# Patient Record
Sex: Female | Born: 1975 | Race: Black or African American | Hispanic: No | Marital: Single | Smoking: Never smoker
Health system: Southern US, Community
[De-identification: ages and names within clinical notes are randomized; demographics above are authoritative.]

## PROBLEM LIST (undated history)

## (undated) DIAGNOSIS — A630 Anogenital (venereal) warts: Secondary | ICD-10-CM

## (undated) HISTORY — DX: Anogenital (venereal) warts: A63.0

## (undated) HISTORY — PX: HAMMER TOE SURGERY: SHX385

---

## 2018-01-28 ENCOUNTER — Other Ambulatory Visit: Payer: Self-pay | Admitting: Family Medicine

## 2018-01-28 DIAGNOSIS — Z1231 Encounter for screening mammogram for malignant neoplasm of breast: Secondary | ICD-10-CM

## 2018-03-05 ENCOUNTER — Ambulatory Visit: Payer: Self-pay

## 2018-04-11 ENCOUNTER — Ambulatory Visit: Payer: Self-pay

## 2018-04-15 ENCOUNTER — Ambulatory Visit: Payer: Self-pay

## 2018-05-16 ENCOUNTER — Ambulatory Visit: Payer: Self-pay

## 2018-05-30 ENCOUNTER — Encounter: Payer: Self-pay | Admitting: Obstetrics and Gynecology

## 2018-06-06 ENCOUNTER — Ambulatory Visit: Payer: Self-pay

## 2018-06-13 ENCOUNTER — Other Ambulatory Visit (HOSPITAL_COMMUNITY)
Admission: RE | Admit: 2018-06-13 | Discharge: 2018-06-13 | Disposition: A | Discharge status: Home / Self Care | Payer: BLUE CROSS/BLUE SHIELD | Source: Ambulatory Visit | Attending: Obstetrics and Gynecology | Admitting: Obstetrics and Gynecology

## 2018-06-13 ENCOUNTER — Ambulatory Visit (INDEPENDENT_AMBULATORY_CARE_PROVIDER_SITE_OTHER): Payer: BLUE CROSS/BLUE SHIELD | Admitting: Obstetrics and Gynecology

## 2018-06-13 ENCOUNTER — Encounter: Payer: Self-pay | Admitting: Obstetrics and Gynecology

## 2018-06-13 VITALS — BP 118/56 | HR 66 | Resp 12 | Ht 62.5 in | Wt 126.1 lb

## 2018-06-13 DIAGNOSIS — N9089 Other specified noninflammatory disorders of vulva and perineum: Secondary | ICD-10-CM

## 2018-06-13 DIAGNOSIS — Z0000 Encounter for general adult medical examination without abnormal findings: Secondary | ICD-10-CM

## 2018-06-13 DIAGNOSIS — Z124 Encounter for screening for malignant neoplasm of cervix: Secondary | ICD-10-CM | POA: Diagnosis not present

## 2018-06-13 DIAGNOSIS — Z113 Encounter for screening for infections with a predominantly sexual mode of transmission: Secondary | ICD-10-CM | POA: Diagnosis present

## 2018-06-13 DIAGNOSIS — Z01419 Encounter for gynecological examination (general) (routine) without abnormal findings: Secondary | ICD-10-CM | POA: Diagnosis not present

## 2018-06-13 NOTE — Patient Instructions (Signed)
EXERCISE AND DIET:  We recommended that you start or continue a regular exercise program for good health. Regular exercise means any activity that makes your heart beat faster and makes you sweat.  We recommend exercising at least 30 minutes per day at least 3 days a week, preferably 4 or 5.  We also recommend a diet low in fat and sugar.  Inactivity, poor dietary choices and obesity can cause diabetes, heart attack, stroke, and kidney damage, among others.    ALCOHOL AND SMOKING:  Women should limit their alcohol intake to no more than 7 drinks/beers/glasses of wine (combined, not each!) per week. Moderation of alcohol intake to this level decreases your risk of breast cancer and liver damage. And of course, no recreational drugs are part of a healthy lifestyle.  And absolutely no smoking or even second hand smoke. Most people know smoking can cause heart and lung diseases, but did you know it also contributes to weakening of your bones? Aging of your skin?  Yellowing of your teeth and nails?  CALCIUM AND VITAMIN D:  Adequate intake of calcium and Vitamin D are recommended.  The recommendations for exact amounts of these supplements seem to change often, but generally speaking 1,000 mg of calcium (between diet and supplement) and 800 units of Vitamin D per day seems prudent. Certain women may benefit from higher intake of Vitamin D.  If you are among these women, your doctor will have told you during your visit.    PAP SMEARS:  Pap smears, to check for cervical cancer or precancers,  have traditionally been done yearly, although recent scientific advances have shown that most women can have pap smears less often.  However, every woman still should have a physical exam from her gynecologist every year. It will include a breast check, inspection of the vulva and vagina to check for abnormal growths or skin changes, a visual exam of the cervix, and then an exam to evaluate the size and shape of the uterus and  ovaries.  And after 45 years of age, a rectal exam is indicated to check for rectal cancers. We will also provide age appropriate advice regarding health maintenance, like when you should have certain vaccines, screening for sexually transmitted diseases, bone density testing, colonoscopy, mammograms, etc.   MAMMOGRAMS:  All women over 40 years old should have a yearly mammogram. Many facilities now offer a "3D" mammogram, which may cost around $50 extra out of pocket. If possible,  we recommend you accept the option to have the 3D mammogram performed.  It both reduces the number of women who will be called back for extra views which then turn out to be normal, and it is better than the routine mammogram at detecting truly abnormal areas.    COLON CANCER SCREENING: Now recommend starting at age 45. At this time colonoscopy is not covered for routine screening until 50. There are take home tests that can be done between 45-49.   COLONOSCOPY:  Colonoscopy to screen for colon cancer is recommended for all women at age 50.  We know, you hate the idea of the prep.  We agree, BUT, having colon cancer and not knowing it is worse!!  Colon cancer so often starts as a polyp that can be seen and removed at colonscopy, which can quite literally save your life!  And if your first colonoscopy is normal and you have no family history of colon cancer, most women don't have to have it again for   10 years.  Once every ten years, you can do something that may end up saving your life, right?  We will be happy to help you get it scheduled when you are ready.  Be sure to check your insurance coverage so you understand how much it will cost.  It may be covered as a preventative service at no cost, but you should check your particular policy.      Breast Self-Awareness Breast self-awareness means being familiar with how your breasts look and feel. It involves checking your breasts regularly and reporting any changes to your  health care provider. Practicing breast self-awareness is important. A change in your breasts can be a sign of a serious medical problem. Being familiar with how your breasts look and feel allows you to find any problems early, when treatment is more likely to be successful. All women should practice breast self-awareness, including women who have had breast implants. How to do a breast self-exam One way to learn what is normal for your breasts and whether your breasts are changing is to do a breast self-exam. To do a breast self-exam: Look for Changes  1. Remove all the clothing above your waist. 2. Stand in front of a mirror in a room with good lighting. 3. Put your hands on your hips. 4. Push your hands firmly downward. 5. Compare your breasts in the mirror. Look for differences between them (asymmetry), such as: ? Differences in shape. ? Differences in size. ? Puckers, dips, and bumps in one breast and not the other. 6. Look at each breast for changes in your skin, such as: ? Redness. ? Scaly areas. 7. Look for changes in your nipples, such as: ? Discharge. ? Bleeding. ? Dimpling. ? Redness. ? A change in position. Feel for Changes Carefully feel your breasts for lumps and changes. It is best to do this while lying on your back on the floor and again while sitting or standing in the shower or tub with soapy water on your skin. Feel each breast in the following way:  Place the arm on the side of the breast you are examining above your head.  Feel your breast with the other hand.  Start in the nipple area and make  inch (2 cm) overlapping circles to feel your breast. Use the pads of your three middle fingers to do this. Apply light pressure, then medium pressure, then firm pressure. The light pressure will allow you to feel the tissue closest to the skin. The medium pressure will allow you to feel the tissue that is a little deeper. The firm pressure will allow you to feel the tissue  close to the ribs.  Continue the overlapping circles, moving downward over the breast until you feel your ribs below your breast.  Move one finger-width toward the center of the body. Continue to use the  inch (2 cm) overlapping circles to feel your breast as you move slowly up toward your collarbone.  Continue the up and down exam using all three pressures until you reach your armpit.  Write Down What You Find  Write down what is normal for each breast and any changes that you find. Keep a written record with breast changes or normal findings for each breast. By writing this information down, you do not need to depend only on memory for size, tenderness, or location. Write down where you are in your menstrual cycle, if you are still menstruating. If you are having trouble noticing differences   in your breasts, do not get discouraged. With time you will become more familiar with the variations in your breasts and more comfortable with the exam. How often should I examine my breasts? Examine your breasts every month. If you are breastfeeding, the best time to examine your breasts is after a feeding or after using a breast pump. If you menstruate, the best time to examine your breasts is 5-7 days after your period is over. During your period, your breasts are lumpier, and it may be more difficult to notice changes. When should I see my health care provider? See your health care provider if you notice:  A change in shape or size of your breasts or nipples.  A change in the skin of your breast or nipples, such as a reddened or scaly area.  Unusual discharge from your nipples.  A lump or thick area that was not there before.  Pain in your breasts.  Anything that concerns you.  

## 2018-06-13 NOTE — Progress Notes (Signed)
43 y.o. G0P0000 Single Black or African American Not Hispanic or Latino female here for annual exam.   She [facility]. The condyloma went down, still there.  Period Cycle (Days): 28 Period Duration (Days): 4-5 Period Pattern: Regular Menstrual Flow: Moderate Menstrual Control: Maxi pad, Tampon Menstrual Control Change Freq (Hours): changes pad/tampon every 3-4 hours Dysmenorrhea: None  Sexually active, same partner x 2 years, not living together. Using condoms. No dyspareunia.   Patient's last menstrual period was 05/30/2018.          Sexually active: Yes.    The current method of family planning is condoms every time.    Exercising: Yes.    running, walking, strength training Smoker:  no  Health Maintenance: Pap:  08-09-15 negative History of abnormal Pap:  no MMG: 08-26-14 BIRADS 1 negative, schedule 07-04-2018 at [facility] BMD:   n/a Colonoscopy: n/a [facility]:  [facility]: [facility]   reports that she has never smoked. She has never used smokeless tobacco. She reports current alcohol use of about 1.0 - 2.0 standard drinks of alcohol per week. She reports that she does not use drugs. 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 Family in [place] (2 brothers and a sister).   Past Medical History:  Diagnosis Date  . Genital warts     Past Surgical History:  Procedure Laterality Date  . HAMMER TOE SURGERY Bilateral    2000- left, 2003-right    Current Outpatient Medications  Medication Sig Dispense Refill  . cetirizine-pseudoephedrine (ZYRTEC-D) 5-120 MG tablet Take by mouth.    . fluticasone (FLONASE) 50 MCG/ACT nasal spray Place into the nose.    . Multiple Vitamin (MULTIVITAMIN) capsule Take by mouth.     No current facility-administered medications for this visit.     Family History  Problem Relation Age of Onset  . Hyperlipidemia Mother   . Hypertension Mother   . Stroke  Mother   Mom is doing okay, mild stroke  Review of Systems  Constitutional: Negative.   HENT: Negative.   Eyes: Negative.   Respiratory: Negative.   Cardiovascular: Negative.   Gastrointestinal: Negative.   Endocrine: Negative.   Genitourinary:       Genital wart  Musculoskeletal: Negative.   Skin: Negative.   Allergic/Immunologic: Negative.   Neurological: Negative.   Hematological: Negative.   Psychiatric/Behavioral: Negative.     Exam:   BP (!) 118/56 (BP Location: Right Arm, Patient Position: Sitting, Cuff Size: Normal)   Pulse 66   Resp 12   Ht 5' 2.5" (1.588 m)   Wt 126 lb 1.6 oz (57.2 kg)   LMP 05/30/2018   BMI 22.70 kg/m   Weight change: @WEIGHTCHANGE @ Height:   Height: 5' 2.5" (158.8 cm)  Ht Readings from Last 3 Encounters:  06/13/18 5' 2.5" (1.588 m)    General appearance: alert, cooperative and appears stated age Head: Normocephalic, without obvious abnormality, atraumatic Neck: no adenopathy, supple, symmetrical, trachea midline and thyroid normal to inspection and palpation Lungs: clear to auscultation bilaterally Cardiovascular: regular rate and rhythm Breasts: normal appearance, no masses or tenderness Abdomen: soft, non-tender; non distended,  no masses,  no organomegaly Extremities: extremities normal, atraumatic, no cyanosis or edema Skin: Skin color, texture, turgor normal. No rashes or lesions Lymph nodes: Cervical, supraclavicular, and axillary nodes normal. No abnormal inguinal nodes palpated Neurologic: Grossly normal   Pelvic: External genitalia:  2 small papillary, skin colored lesions on her upper clitoris               Urethra:  normal appearing urethra with no masses, tenderness or lesions              Bartholins and Skenes: normal                 Vagina: normal appearing vagina with normal color and discharge, no lesions              Cervix: no lesions               Bimanual Exam:  Uterus:  normal size, contour, position, consistency,  mobility, non-tender              Adnexa: no mass, fullness, tenderness               Rectovaginal: Confirms               Anus:  normal sphincter tone, no lesions  Chaperone was present for exam.  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 She desires removal.  She will finish her gardasil series at school

## 2018-06-16 LAB — CYTOLOGY - PAP
CHLAMYDIA, DNA PROBE: NEGATIVE
Diagnosis: NEGATIVE
HPV: NOT DETECTED
NEISSERIA GONORRHEA: NEGATIVE
Trichomonas: NEGATIVE

## 2018-06-25 NOTE — Progress Notes (Signed)
GYNECOLOGY  VISIT   HPI: 43 y.o.   Single Black or African American Not Hispanic or Latino  female   G0P0000 with Patient's last menstrual period was 06/24/2018 (exact date).   here for vulvar biopsy. She has 2 small vulvar lesions that got smaller with TCA, but didn't resolve. She was told they were condyloma.   GYNECOLOGIC HISTORY: Patient's last menstrual period was 06/24/2018 (exact date). Contraception: Condoms Menopausal hormone therapy: None       OB History    Gravida  0   Para  0   Term  0   Preterm  0   AB  0   Living  0     SAB  0   TAB  0   Ectopic  0   Multiple  0   Live Births  0              There are no active problems to display for this patient.   Past Medical History:  Diagnosis Date  . Genital warts     Past Surgical History:  Procedure Laterality Date  . HAMMER TOE SURGERY Bilateral    2000- left, 2003-right    Current Outpatient Medications  Medication Sig Dispense Refill  . cetirizine-pseudoephedrine (ZYRTEC-D) 5-120 MG tablet Take by mouth.    . fluticasone (FLONASE) 50 MCG/ACT nasal spray Place into the nose.    . Multiple Vitamin (MULTIVITAMIN) capsule Take by mouth.     No current facility-administered medications for this visit.      ALLERGIES: Patient has no known allergies.  Family History  Problem Relation Age of Onset  . Hyperlipidemia Mother   . Hypertension Mother   . Stroke Mother     Social History   Socioeconomic History  . Marital status: Single    Spouse name: Not on file  . Number of children: Not on file  . Years of education: Not on file  . Highest education level: Not on file  Occupational History  . Not on file  Social Needs  . Financial resource strain: Not on file  . Food insecurity:    Worry: Not on file    Inability: Not on file  . Transportation needs:    Medical: Not on file    Non-medical: Not on file  Tobacco Use  . Smoking status: Never Smoker  . Smokeless tobacco: Never  Used  Substance and Sexual Activity  . Alcohol use: Yes    Alcohol/week: 1.0 - 2.0 standard drinks    Types: 1 - 2 Glasses of wine per week  . Drug use: Never  . Sexual activity: Yes    Birth control/protection: Condom  Lifestyle  . Physical activity:    Days per week: Not on file    Minutes per session: Not on file  . Stress: Not on file  Relationships  . Social connections:    Talks on phone: Not on file    Gets together: Not on file    Attends religious service: Not on file    Active member of club or organization: Not on file    Attends meetings of clubs or organizations: Not on file    Relationship status: Not on file  . Intimate partner violence:    Fear of current or ex partner: Not on file    Emotionally abused: Not on file    Physically abused: Not on file    Forced sexual activity: Not on file  Other Topics Concern  .  Not on file  Social History Narrative  . Not on file    Review of Systems  Constitutional: Negative.   HENT: Negative.   Eyes: Negative.   Respiratory: Negative.   Cardiovascular: Negative.   Gastrointestinal: Negative.   Genitourinary:       Vulvar bumps  Musculoskeletal: Negative.   Skin: Negative.   Neurological: Negative.   Endo/Heme/Allergies: Negative.   Psychiatric/Behavioral: Negative.     PHYSICAL EXAMINATION:    BP 118/72 (BP Location: Right Arm, Patient Position: Sitting, Cuff Size: Normal)   Pulse 64   Wt 128 lb 12.8 oz (58.4 kg)   LMP 06/24/2018 (Exact Date)   BMI 23.18 kg/m     General appearance: alert, cooperative and appears stated age  Pelvic: External genitalia:  2 small papillary, skin colored lesions on her proximal clitoris.  The risks of the procedure were reviewed with the patient and a consent was signed. The area was cleansed with betadine and injected with 1% lidocaine. A #11 blade was used to remove each of the lesions. The biopsy sites were treated with silver nitrate with excellent hemostasis. The  patient tolerated the procedure well.                  Both lesions were sent in one pathology container.  Chaperone was present for exam.  ASSESSMENT Vulvar lesions    PLAN Lesions removed and sent to pathology   An After Visit Summary was printed and given to the patient.

## 2018-06-30 ENCOUNTER — Other Ambulatory Visit: Payer: Self-pay

## 2018-06-30 ENCOUNTER — Encounter: Payer: Self-pay | Admitting: Obstetrics and Gynecology

## 2018-06-30 ENCOUNTER — Ambulatory Visit (INDEPENDENT_AMBULATORY_CARE_PROVIDER_SITE_OTHER): Payer: BLUE CROSS/BLUE SHIELD | Admitting: Obstetrics and Gynecology

## 2018-06-30 DIAGNOSIS — N9089 Other specified noninflammatory disorders of vulva and perineum: Secondary | ICD-10-CM | POA: Diagnosis not present

## 2018-06-30 NOTE — Patient Instructions (Signed)

## 2018-07-01 ENCOUNTER — Telehealth: Payer: Self-pay | Admitting: Obstetrics and Gynecology

## 2018-07-01 NOTE — Telephone Encounter (Signed)
Spoke with patient, seen in office on 2/24 for vulvar biopsy. Patient denies any concerns with biopsy sites, asking if ok to apply neosporin topically?  Advised ok to apply neosporin. Advised patient to return call to office  should any redness, increased pain or d/c develop. Patient verbalizes understanding.   Routing to provider for final review. Patient is agreeable to disposition. Will close encounter.

## 2018-07-01 NOTE — Telephone Encounter (Signed)
Patient is asking if it is okay to use neosporin on her biopsy site?

## 2018-07-03 ENCOUNTER — Telehealth: Payer: Self-pay

## 2018-07-03 NOTE — Telephone Encounter (Signed)
Patient returned call to nurse Kaitlyn. °

## 2018-07-03 NOTE — Telephone Encounter (Signed)
-----   Message from Romualdo Bolk, MD sent at 07/02/2018  5:09 PM EST ----- Please inform the patient that the biopsies did return with condyloma and see how she is feeling

## 2018-07-03 NOTE — Telephone Encounter (Signed)
Spoke with patient. Results given. Patient verbalizes understanding. Reports she is feeling well and has no concerns. Encounter closed.

## 2018-07-03 NOTE — Telephone Encounter (Signed)
Left message to call Kaitlyn at [phone]. 

## 2018-07-04 ENCOUNTER — Ambulatory Visit
Admission: RE | Admit: 2018-07-04 | Discharge: 2018-07-04 | Disposition: A | Discharge status: Home / Self Care | Payer: BLUE CROSS/BLUE SHIELD | Source: Ambulatory Visit | Attending: Family Medicine | Admitting: Family Medicine

## 2018-07-04 DIAGNOSIS — Z1231 Encounter for screening mammogram for malignant neoplasm of breast: Secondary | ICD-10-CM

## 2018-07-08 ENCOUNTER — Other Ambulatory Visit: Payer: Self-pay | Admitting: Family Medicine

## 2018-07-08 DIAGNOSIS — R928 Other abnormal and inconclusive findings on diagnostic imaging of breast: Secondary | ICD-10-CM

## 2018-07-18 ENCOUNTER — Ambulatory Visit
Admission: RE | Admit: 2018-07-18 | Discharge: 2018-07-18 | Disposition: A | Discharge status: Home / Self Care | Payer: BLUE CROSS/BLUE SHIELD | Source: Ambulatory Visit | Attending: Family Medicine | Admitting: Family Medicine

## 2018-07-18 ENCOUNTER — Other Ambulatory Visit: Payer: Self-pay | Admitting: Family Medicine

## 2018-07-18 ENCOUNTER — Other Ambulatory Visit: Payer: Self-pay

## 2018-07-18 DIAGNOSIS — R928 Other abnormal and inconclusive findings on diagnostic imaging of breast: Secondary | ICD-10-CM

## 2018-07-18 DIAGNOSIS — N6320 Unspecified lump in the left breast, unspecified quadrant: Secondary | ICD-10-CM

## 2019-01-21 ENCOUNTER — Other Ambulatory Visit: Payer: Self-pay

## 2019-01-21 ENCOUNTER — Ambulatory Visit
Admission: RE | Admit: 2019-01-21 | Discharge: 2019-01-21 | Disposition: A | Discharge status: Home / Self Care | Payer: BC Managed Care – PPO | Source: Ambulatory Visit | Attending: Family Medicine | Admitting: Family Medicine

## 2019-01-21 ENCOUNTER — Other Ambulatory Visit: Payer: Self-pay | Admitting: Family Medicine

## 2019-01-21 DIAGNOSIS — N6320 Unspecified lump in the left breast, unspecified quadrant: Secondary | ICD-10-CM

## 2019-07-21 ENCOUNTER — Other Ambulatory Visit: Payer: Self-pay

## 2019-07-21 ENCOUNTER — Ambulatory Visit
Admission: RE | Admit: 2019-07-21 | Discharge: 2019-07-21 | Disposition: A | Discharge status: Home / Self Care | Payer: BC Managed Care – PPO | Source: Ambulatory Visit | Attending: Family Medicine | Admitting: Family Medicine

## 2019-07-21 DIAGNOSIS — N6320 Unspecified lump in the left breast, unspecified quadrant: Secondary | ICD-10-CM

## 2019-12-29 IMAGING — MG DIGITAL SCREENING BILATERAL MAMMOGRAM WITH TOMO AND CAD
4 series · 4 of 12 positions shown · non-contrast
Comparison: Previous exam(s).

CLINICAL DATA: Screening.

EXAM:
DIGITAL SCREENING BILATERAL MAMMOGRAM WITH TOMO AND CAD

[R CC synth-2D]
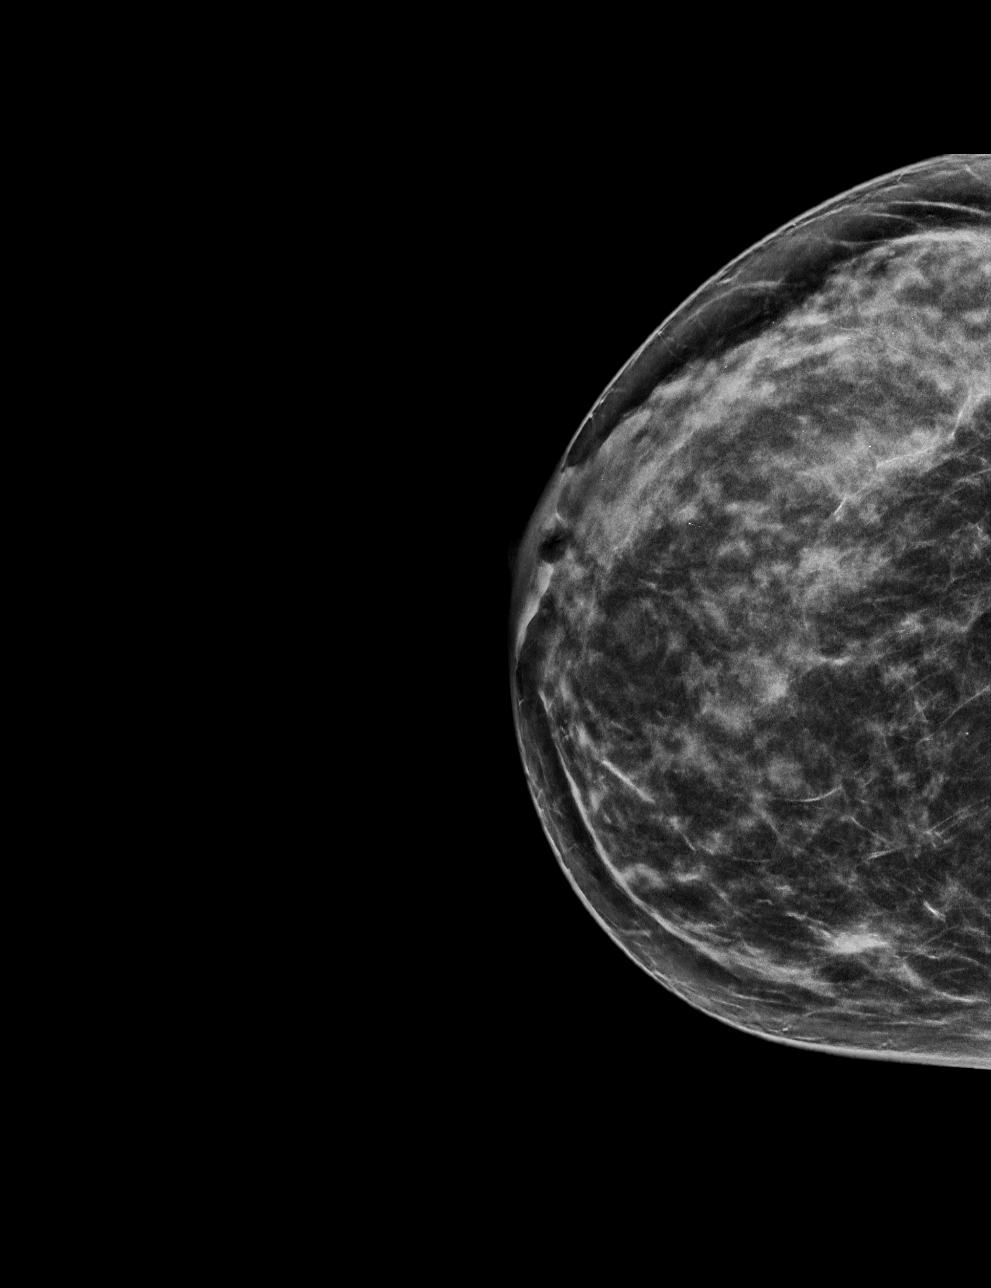

[R MLO synth-2D]
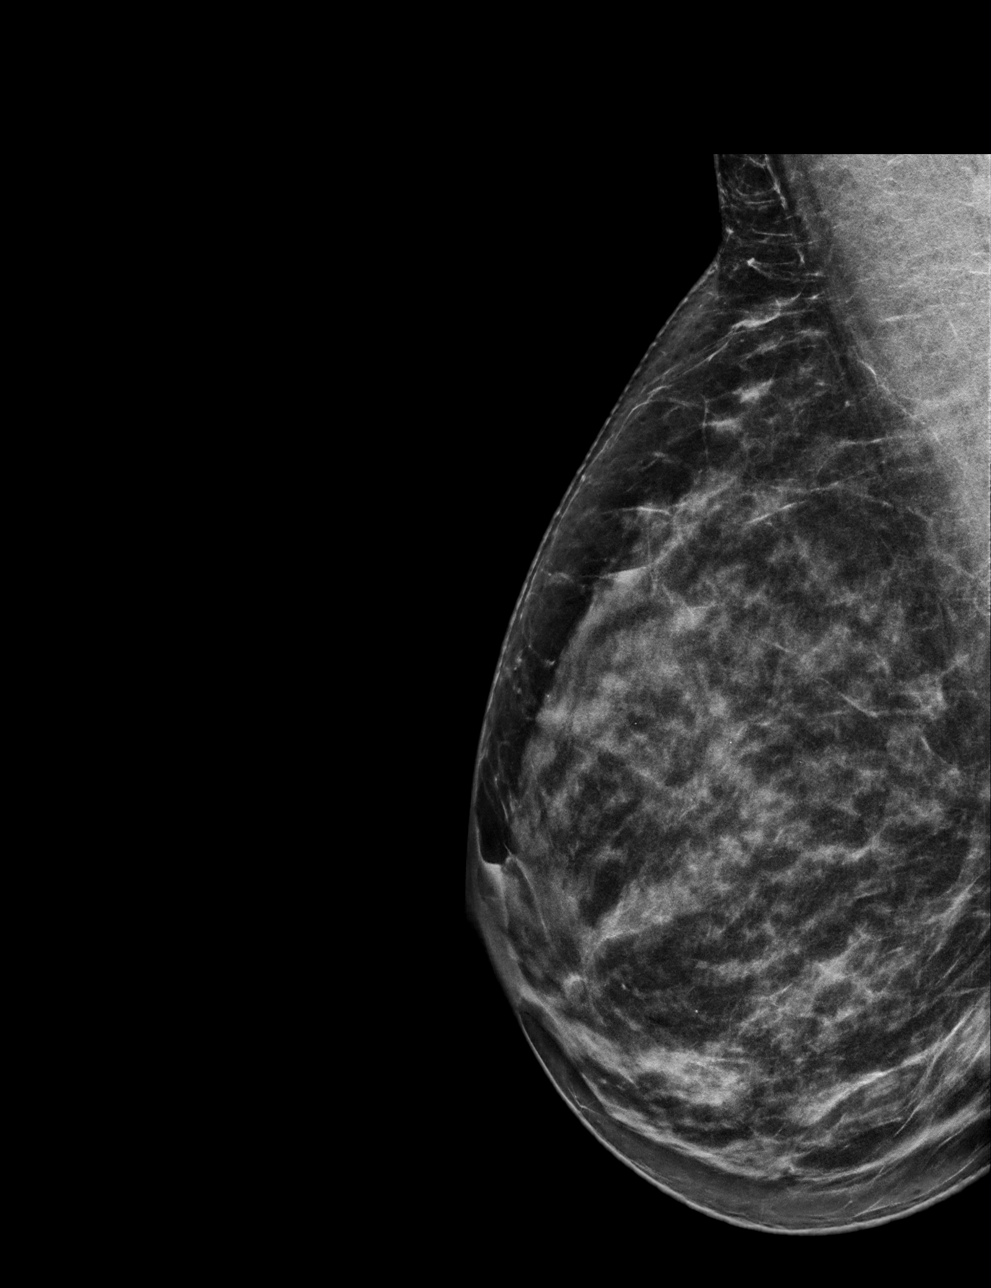

[R MLO tomo · tomo slice 33/64.0]
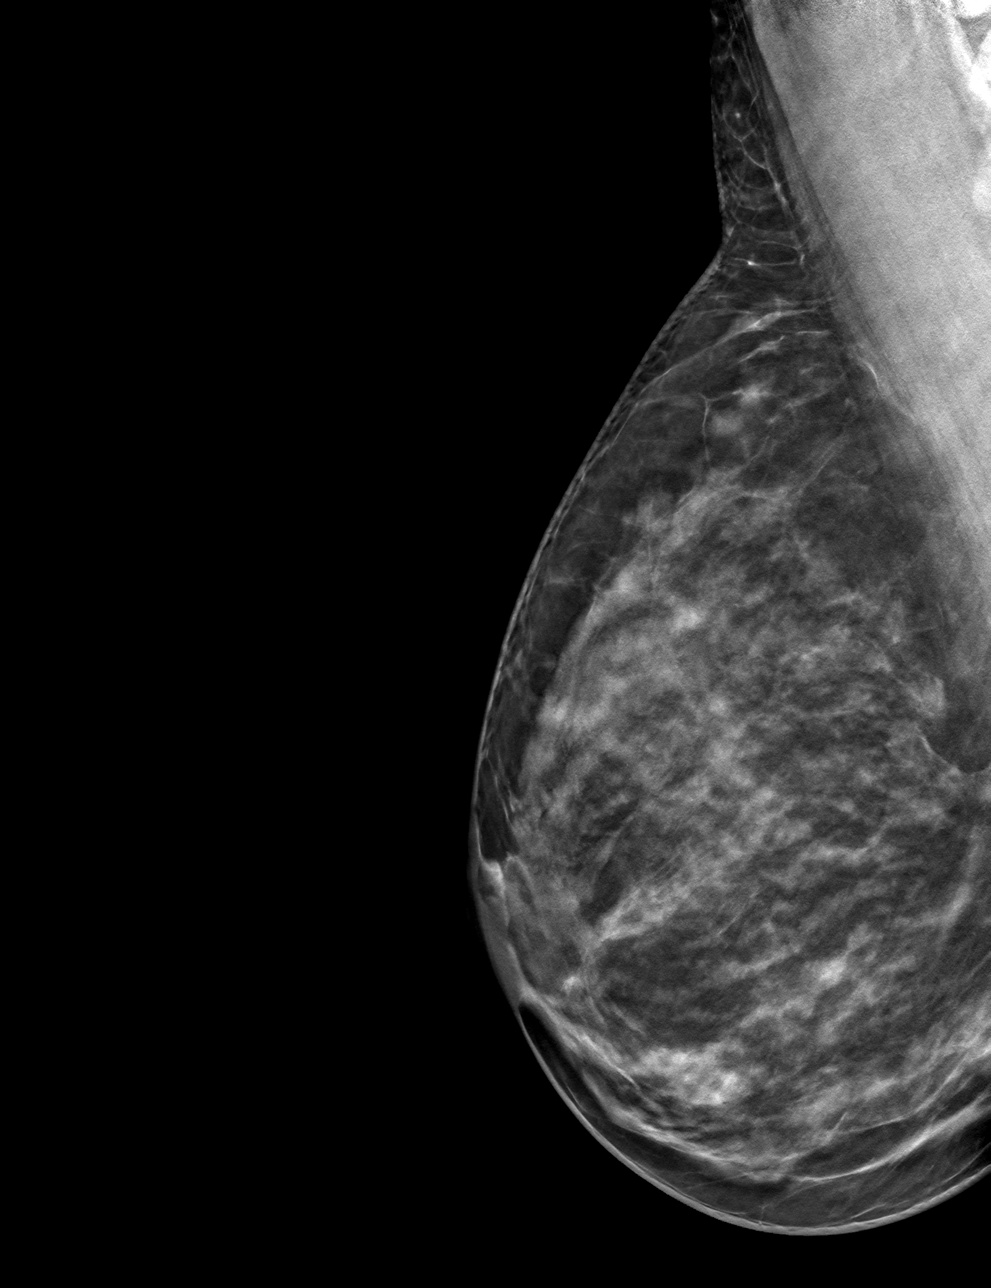

[L MLO tomo · tomo slice 34/67.0]
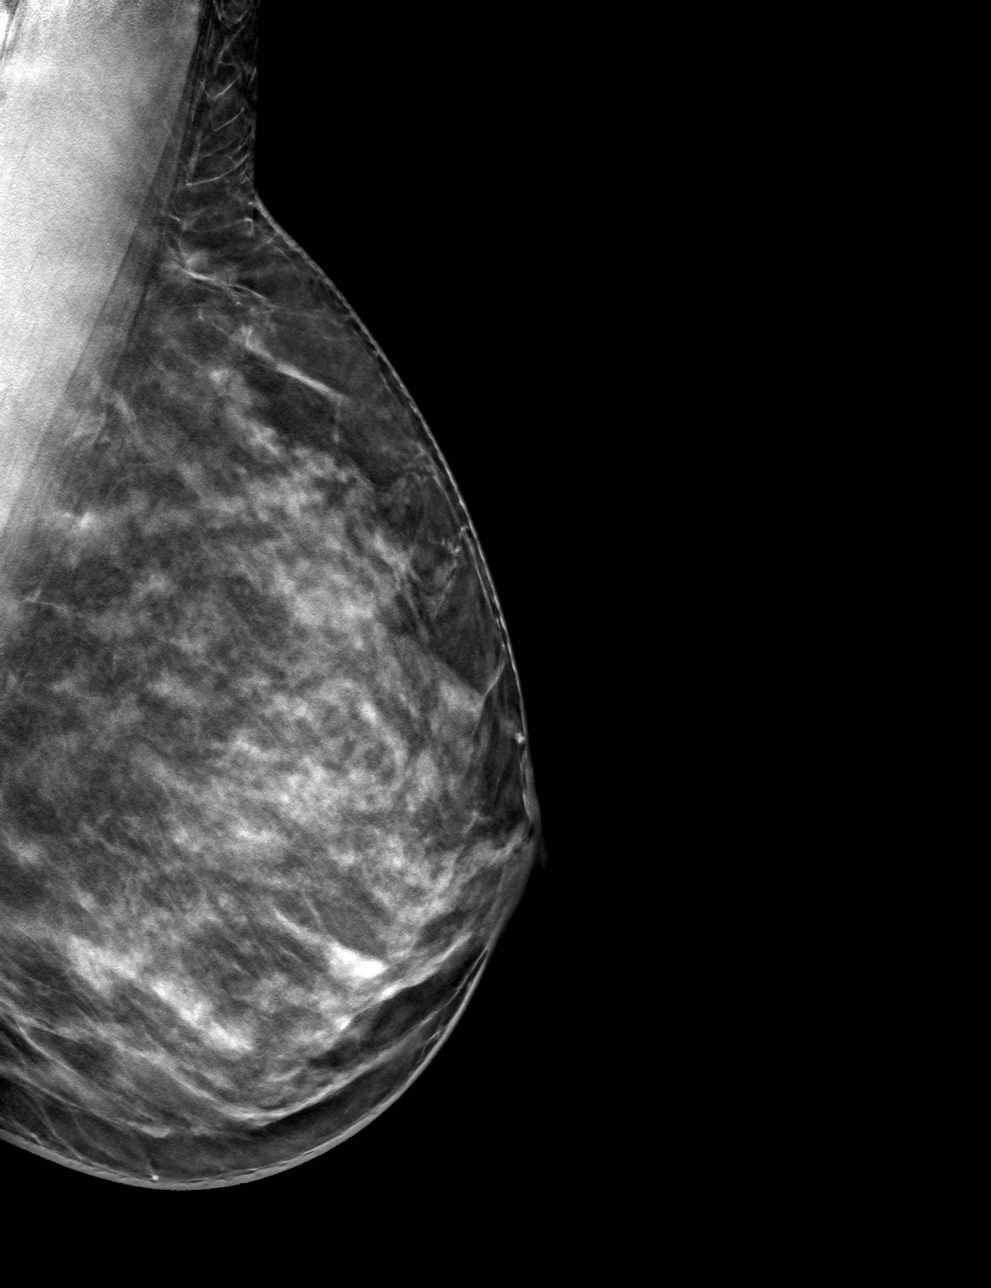

[4 of 12 positions shown; findings below may reference images not displayed]

ACR Breast Density Category c: The breast tissue is heterogeneously
dense, which may obscure small masses.
FINDINGS: In the left breast, a possible mass warrants further evaluation. In
the right breast, no findings suspicious for malignancy.

Images were processed with CAD.
IMPRESSION: Further evaluation is suggested for possible mass in the left
breast.

RECOMMENDATION:
Diagnostic mammogram and possibly ultrasound of the left breast.
(Code:IJ-[id number])

The patient will be contacted regarding the findings, and additional
imaging will be scheduled.

BI-RADS CATEGORY  0: Incomplete. Need additional imaging evaluation
and/or prior mammograms for comparison.

## 2020-06-10 ENCOUNTER — Other Ambulatory Visit: Payer: Self-pay | Admitting: Orthodontics and Dentofacial Orthopedics

## 2020-06-10 ENCOUNTER — Other Ambulatory Visit: Payer: Self-pay | Admitting: Family Medicine

## 2020-06-10 DIAGNOSIS — N6002 Solitary cyst of left breast: Secondary | ICD-10-CM

## 2020-07-27 ENCOUNTER — Ambulatory Visit
Admission: RE | Admit: 2020-07-27 | Discharge: 2020-07-27 | Disposition: A | Discharge status: Home / Self Care | Payer: BC Managed Care – PPO | Source: Ambulatory Visit | Attending: Family Medicine | Admitting: Family Medicine

## 2020-07-27 ENCOUNTER — Other Ambulatory Visit: Payer: Self-pay

## 2020-07-27 DIAGNOSIS — N6002 Solitary cyst of left breast: Secondary | ICD-10-CM

## 2021-06-26 ENCOUNTER — Encounter: Payer: Self-pay | Admitting: Internal Medicine
